# Patient Record
Sex: Male | Born: 1990 | Hispanic: No | Marital: Single | Smoking: Current every day smoker
Health system: Southern US, Community
[De-identification: ages and names within clinical notes are randomized; demographics above are authoritative.]

---

## 2018-09-20 ENCOUNTER — Emergency Department (HOSPITAL_BASED_OUTPATIENT_CLINIC_OR_DEPARTMENT_OTHER): Payer: Self-pay

## 2018-09-20 ENCOUNTER — Emergency Department (HOSPITAL_BASED_OUTPATIENT_CLINIC_OR_DEPARTMENT_OTHER)
Admission: EM | Admit: 2018-09-20 | Discharge: 2018-09-20 | Disposition: A | Discharge status: Home / Self Care | Payer: Self-pay | Attending: Emergency Medicine | Admitting: Emergency Medicine

## 2018-09-20 ENCOUNTER — Other Ambulatory Visit: Payer: Self-pay

## 2018-09-20 ENCOUNTER — Encounter (HOSPITAL_BASED_OUTPATIENT_CLINIC_OR_DEPARTMENT_OTHER): Payer: Self-pay | Admitting: Adult Health

## 2018-09-20 DIAGNOSIS — S199XXA Unspecified injury of neck, initial encounter: Secondary | ICD-10-CM | POA: Diagnosis present

## 2018-09-20 DIAGNOSIS — F17210 Nicotine dependence, cigarettes, uncomplicated: Secondary | ICD-10-CM | POA: Diagnosis not present

## 2018-09-20 DIAGNOSIS — M25511 Pain in right shoulder: Secondary | ICD-10-CM | POA: Diagnosis not present

## 2018-09-20 DIAGNOSIS — S161XXA Strain of muscle, fascia and tendon at neck level, initial encounter: Secondary | ICD-10-CM | POA: Diagnosis not present

## 2018-09-20 DIAGNOSIS — S0990XA Unspecified injury of head, initial encounter: Secondary | ICD-10-CM | POA: Diagnosis not present

## 2018-09-20 DIAGNOSIS — Y9389 Activity, other specified: Secondary | ICD-10-CM | POA: Insufficient documentation

## 2018-09-20 DIAGNOSIS — S0083XA Contusion of other part of head, initial encounter: Secondary | ICD-10-CM | POA: Insufficient documentation

## 2018-09-20 DIAGNOSIS — Y92410 Unspecified street and highway as the place of occurrence of the external cause: Secondary | ICD-10-CM | POA: Diagnosis not present

## 2018-09-20 DIAGNOSIS — Y999 Unspecified external cause status: Secondary | ICD-10-CM | POA: Diagnosis not present

## 2018-09-20 MED ORDER — IBUPROFEN 800 MG PO TABS: 800.0000 mg | ORAL_TABLET | Freq: Three times a day (TID) | ORAL | 0 refills | Status: AC | PRN

## 2018-09-20 MED ORDER — HYDROCODONE-ACETAMINOPHEN 5-325 MG PO TABS
1.0000 | ORAL_TABLET | Freq: Once | ORAL | Status: AC
  Administered 2018-09-20: 14:00:00 1 via ORAL
  Filled 2018-09-20: qty 1

## 2018-09-20 MED ORDER — CYCLOBENZAPRINE HCL 10 MG PO TABS: 10.0000 mg | ORAL_TABLET | Freq: Three times a day (TID) | ORAL | 0 refills | Status: AC | PRN

## 2018-09-20 MED FILL — IBUPROFEN 800 MG TAB: 800 | 7 days supply | Qty: 21 | Fill #0

## 2018-09-20 MED FILL — CYCLOBENZAPRINE HCL 10 MG T: 10 | 5 days supply | Qty: 15 | Fill #0
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
